# Patient Record
Sex: Male | Born: 1945 | Race: White | Hispanic: No | Smoking: Never smoker
Health system: Southern US, Community
[De-identification: ages and names within clinical notes are randomized; demographics above are authoritative.]

## PROBLEM LIST (undated history)

## (undated) DIAGNOSIS — N400 Enlarged prostate without lower urinary tract symptoms: Secondary | ICD-10-CM

## (undated) HISTORY — PX: EXCISIONAL HEMORRHOIDECTOMY: SHX1541

## (undated) HISTORY — PX: KNEE ARTHROSCOPY: SUR90

## (undated) HISTORY — PX: BACK SURGERY: SHX140

## (undated) HISTORY — DX: Benign prostatic hyperplasia without lower urinary tract symptoms: N40.0

## (undated) HISTORY — PX: TONSILLECTOMY: SUR1361

---

## 2016-12-01 DIAGNOSIS — H2513 Age-related nuclear cataract, bilateral: Secondary | ICD-10-CM | POA: Diagnosis not present

## 2016-12-01 DIAGNOSIS — H5203 Hypermetropia, bilateral: Secondary | ICD-10-CM | POA: Diagnosis not present

## 2016-12-01 DIAGNOSIS — H524 Presbyopia: Secondary | ICD-10-CM | POA: Diagnosis not present

## 2016-12-01 DIAGNOSIS — H3554 Dystrophies primarily involving the retinal pigment epithelium: Secondary | ICD-10-CM | POA: Diagnosis not present

## 2016-12-01 DIAGNOSIS — H52223 Regular astigmatism, bilateral: Secondary | ICD-10-CM | POA: Diagnosis not present

## 2017-01-10 DIAGNOSIS — Z139 Encounter for screening, unspecified: Secondary | ICD-10-CM | POA: Diagnosis not present

## 2017-01-10 DIAGNOSIS — J111 Influenza due to unidentified influenza virus with other respiratory manifestations: Secondary | ICD-10-CM | POA: Diagnosis not present

## 2017-01-10 DIAGNOSIS — J40 Bronchitis, not specified as acute or chronic: Secondary | ICD-10-CM | POA: Diagnosis not present

## 2017-01-10 DIAGNOSIS — Z789 Other specified health status: Secondary | ICD-10-CM | POA: Diagnosis not present

## 2017-01-10 DIAGNOSIS — Z6830 Body mass index (BMI) 30.0-30.9, adult: Secondary | ICD-10-CM | POA: Diagnosis not present

## 2017-02-18 DIAGNOSIS — Z789 Other specified health status: Secondary | ICD-10-CM | POA: Diagnosis not present

## 2017-02-18 DIAGNOSIS — R002 Palpitations: Secondary | ICD-10-CM | POA: Diagnosis not present

## 2017-02-18 DIAGNOSIS — R001 Bradycardia, unspecified: Secondary | ICD-10-CM | POA: Diagnosis not present

## 2017-02-28 DIAGNOSIS — R42 Dizziness and giddiness: Secondary | ICD-10-CM | POA: Diagnosis not present

## 2017-02-28 DIAGNOSIS — R001 Bradycardia, unspecified: Secondary | ICD-10-CM | POA: Diagnosis not present

## 2017-03-04 DIAGNOSIS — R002 Palpitations: Secondary | ICD-10-CM | POA: Diagnosis not present

## 2017-03-07 DIAGNOSIS — R42 Dizziness and giddiness: Secondary | ICD-10-CM | POA: Diagnosis not present

## 2017-03-07 DIAGNOSIS — R001 Bradycardia, unspecified: Secondary | ICD-10-CM | POA: Diagnosis not present

## 2017-05-12 DIAGNOSIS — R350 Frequency of micturition: Secondary | ICD-10-CM | POA: Diagnosis not present

## 2017-05-12 DIAGNOSIS — R351 Nocturia: Secondary | ICD-10-CM | POA: Diagnosis not present

## 2017-05-12 DIAGNOSIS — R3914 Feeling of incomplete bladder emptying: Secondary | ICD-10-CM | POA: Diagnosis not present

## 2017-05-12 DIAGNOSIS — R972 Elevated prostate specific antigen [PSA]: Secondary | ICD-10-CM | POA: Diagnosis not present

## 2017-05-12 DIAGNOSIS — N400 Enlarged prostate without lower urinary tract symptoms: Secondary | ICD-10-CM | POA: Diagnosis not present

## 2017-05-16 DIAGNOSIS — M9903 Segmental and somatic dysfunction of lumbar region: Secondary | ICD-10-CM | POA: Diagnosis not present

## 2017-05-16 DIAGNOSIS — S33141A Dislocation of L4/L5 lumbar vertebra, initial encounter: Secondary | ICD-10-CM | POA: Diagnosis not present

## 2017-05-16 DIAGNOSIS — M9902 Segmental and somatic dysfunction of thoracic region: Secondary | ICD-10-CM | POA: Diagnosis not present

## 2017-10-12 DIAGNOSIS — S33141A Dislocation of L4/L5 lumbar vertebra, initial encounter: Secondary | ICD-10-CM | POA: Diagnosis not present

## 2017-10-12 DIAGNOSIS — M9903 Segmental and somatic dysfunction of lumbar region: Secondary | ICD-10-CM | POA: Diagnosis not present

## 2017-10-12 DIAGNOSIS — M9902 Segmental and somatic dysfunction of thoracic region: Secondary | ICD-10-CM | POA: Diagnosis not present

## 2017-12-07 DIAGNOSIS — H3550 Unspecified hereditary retinal dystrophy: Secondary | ICD-10-CM | POA: Diagnosis not present

## 2017-12-07 DIAGNOSIS — H2513 Age-related nuclear cataract, bilateral: Secondary | ICD-10-CM | POA: Diagnosis not present

## 2017-12-07 DIAGNOSIS — H5203 Hypermetropia, bilateral: Secondary | ICD-10-CM | POA: Diagnosis not present

## 2017-12-07 DIAGNOSIS — H52223 Regular astigmatism, bilateral: Secondary | ICD-10-CM | POA: Diagnosis not present

## 2017-12-07 DIAGNOSIS — H524 Presbyopia: Secondary | ICD-10-CM | POA: Diagnosis not present

## 2018-01-30 DIAGNOSIS — M9902 Segmental and somatic dysfunction of thoracic region: Secondary | ICD-10-CM | POA: Diagnosis not present

## 2018-01-30 DIAGNOSIS — S33141A Dislocation of L4/L5 lumbar vertebra, initial encounter: Secondary | ICD-10-CM | POA: Diagnosis not present

## 2018-01-30 DIAGNOSIS — M9903 Segmental and somatic dysfunction of lumbar region: Secondary | ICD-10-CM | POA: Diagnosis not present

## 2018-02-06 DIAGNOSIS — M25551 Pain in right hip: Secondary | ICD-10-CM | POA: Diagnosis not present

## 2018-02-06 DIAGNOSIS — J069 Acute upper respiratory infection, unspecified: Secondary | ICD-10-CM | POA: Diagnosis not present

## 2018-02-06 DIAGNOSIS — R05 Cough: Secondary | ICD-10-CM | POA: Diagnosis not present

## 2018-04-21 DIAGNOSIS — M1611 Unilateral primary osteoarthritis, right hip: Secondary | ICD-10-CM | POA: Diagnosis not present

## 2018-04-21 DIAGNOSIS — M545 Low back pain: Secondary | ICD-10-CM | POA: Diagnosis not present

## 2018-04-21 DIAGNOSIS — M25511 Pain in right shoulder: Secondary | ICD-10-CM | POA: Diagnosis not present

## 2018-04-21 DIAGNOSIS — M19012 Primary osteoarthritis, left shoulder: Secondary | ICD-10-CM | POA: Diagnosis not present

## 2018-05-15 DIAGNOSIS — S80869A Insect bite (nonvenomous), unspecified lower leg, initial encounter: Secondary | ICD-10-CM | POA: Diagnosis not present

## 2018-05-15 DIAGNOSIS — M791 Myalgia, unspecified site: Secondary | ICD-10-CM | POA: Diagnosis not present

## 2018-05-19 DIAGNOSIS — N400 Enlarged prostate without lower urinary tract symptoms: Secondary | ICD-10-CM | POA: Diagnosis not present

## 2018-05-19 DIAGNOSIS — R351 Nocturia: Secondary | ICD-10-CM | POA: Diagnosis not present

## 2018-05-19 DIAGNOSIS — R972 Elevated prostate specific antigen [PSA]: Secondary | ICD-10-CM | POA: Diagnosis not present

## 2018-05-19 DIAGNOSIS — Z6829 Body mass index (BMI) 29.0-29.9, adult: Secondary | ICD-10-CM | POA: Diagnosis not present

## 2018-06-08 DIAGNOSIS — Z6829 Body mass index (BMI) 29.0-29.9, adult: Secondary | ICD-10-CM | POA: Diagnosis not present

## 2018-06-08 DIAGNOSIS — R972 Elevated prostate specific antigen [PSA]: Secondary | ICD-10-CM | POA: Diagnosis not present

## 2018-06-08 DIAGNOSIS — N400 Enlarged prostate without lower urinary tract symptoms: Secondary | ICD-10-CM | POA: Diagnosis not present

## 2018-06-08 DIAGNOSIS — R351 Nocturia: Secondary | ICD-10-CM | POA: Diagnosis not present

## 2018-06-26 DIAGNOSIS — R972 Elevated prostate specific antigen [PSA]: Secondary | ICD-10-CM | POA: Diagnosis not present

## 2018-07-04 DIAGNOSIS — Z6832 Body mass index (BMI) 32.0-32.9, adult: Secondary | ICD-10-CM | POA: Diagnosis not present

## 2018-07-04 DIAGNOSIS — M353 Polymyalgia rheumatica: Secondary | ICD-10-CM | POA: Diagnosis not present

## 2018-07-04 DIAGNOSIS — M791 Myalgia, unspecified site: Secondary | ICD-10-CM | POA: Diagnosis not present

## 2018-07-04 DIAGNOSIS — E669 Obesity, unspecified: Secondary | ICD-10-CM | POA: Diagnosis not present

## 2018-07-04 DIAGNOSIS — Z7952 Long term (current) use of systemic steroids: Secondary | ICD-10-CM | POA: Diagnosis not present

## 2018-07-04 DIAGNOSIS — M2550 Pain in unspecified joint: Secondary | ICD-10-CM | POA: Diagnosis not present

## 2018-07-04 DIAGNOSIS — R700 Elevated erythrocyte sedimentation rate: Secondary | ICD-10-CM | POA: Diagnosis not present

## 2018-07-10 DIAGNOSIS — N400 Enlarged prostate without lower urinary tract symptoms: Secondary | ICD-10-CM | POA: Diagnosis not present

## 2018-07-10 DIAGNOSIS — R351 Nocturia: Secondary | ICD-10-CM | POA: Diagnosis not present

## 2018-07-10 DIAGNOSIS — Z6829 Body mass index (BMI) 29.0-29.9, adult: Secondary | ICD-10-CM | POA: Diagnosis not present

## 2018-07-10 DIAGNOSIS — R972 Elevated prostate specific antigen [PSA]: Secondary | ICD-10-CM | POA: Diagnosis not present

## 2018-07-14 DIAGNOSIS — R972 Elevated prostate specific antigen [PSA]: Secondary | ICD-10-CM | POA: Diagnosis not present

## 2018-07-26 DIAGNOSIS — N400 Enlarged prostate without lower urinary tract symptoms: Secondary | ICD-10-CM | POA: Diagnosis not present

## 2018-07-26 DIAGNOSIS — R351 Nocturia: Secondary | ICD-10-CM | POA: Diagnosis not present

## 2018-07-26 DIAGNOSIS — R972 Elevated prostate specific antigen [PSA]: Secondary | ICD-10-CM | POA: Diagnosis not present

## 2018-08-18 DIAGNOSIS — M791 Myalgia, unspecified site: Secondary | ICD-10-CM | POA: Diagnosis not present

## 2018-08-18 DIAGNOSIS — Z7952 Long term (current) use of systemic steroids: Secondary | ICD-10-CM | POA: Diagnosis not present

## 2018-08-18 DIAGNOSIS — Z6833 Body mass index (BMI) 33.0-33.9, adult: Secondary | ICD-10-CM | POA: Diagnosis not present

## 2018-08-18 DIAGNOSIS — M353 Polymyalgia rheumatica: Secondary | ICD-10-CM | POA: Diagnosis not present

## 2018-08-18 DIAGNOSIS — E669 Obesity, unspecified: Secondary | ICD-10-CM | POA: Diagnosis not present

## 2018-08-18 DIAGNOSIS — R700 Elevated erythrocyte sedimentation rate: Secondary | ICD-10-CM | POA: Diagnosis not present

## 2018-08-18 DIAGNOSIS — M2550 Pain in unspecified joint: Secondary | ICD-10-CM | POA: Diagnosis not present

## 2018-10-03 DIAGNOSIS — R700 Elevated erythrocyte sedimentation rate: Secondary | ICD-10-CM | POA: Diagnosis not present

## 2018-10-03 DIAGNOSIS — Z7952 Long term (current) use of systemic steroids: Secondary | ICD-10-CM | POA: Diagnosis not present

## 2018-10-03 DIAGNOSIS — Z6832 Body mass index (BMI) 32.0-32.9, adult: Secondary | ICD-10-CM | POA: Diagnosis not present

## 2018-10-03 DIAGNOSIS — E669 Obesity, unspecified: Secondary | ICD-10-CM | POA: Diagnosis not present

## 2018-10-03 DIAGNOSIS — M353 Polymyalgia rheumatica: Secondary | ICD-10-CM | POA: Diagnosis not present

## 2018-10-03 DIAGNOSIS — M791 Myalgia, unspecified site: Secondary | ICD-10-CM | POA: Diagnosis not present

## 2018-10-03 DIAGNOSIS — M2550 Pain in unspecified joint: Secondary | ICD-10-CM | POA: Diagnosis not present

## 2018-10-26 DIAGNOSIS — R350 Frequency of micturition: Secondary | ICD-10-CM | POA: Diagnosis not present

## 2018-10-26 DIAGNOSIS — N401 Enlarged prostate with lower urinary tract symptoms: Secondary | ICD-10-CM | POA: Diagnosis not present

## 2018-10-26 DIAGNOSIS — R3912 Poor urinary stream: Secondary | ICD-10-CM | POA: Diagnosis not present

## 2018-10-26 DIAGNOSIS — Z6829 Body mass index (BMI) 29.0-29.9, adult: Secondary | ICD-10-CM | POA: Diagnosis not present

## 2018-10-26 DIAGNOSIS — R972 Elevated prostate specific antigen [PSA]: Secondary | ICD-10-CM | POA: Diagnosis not present

## 2018-12-05 DIAGNOSIS — Z7952 Long term (current) use of systemic steroids: Secondary | ICD-10-CM | POA: Diagnosis not present

## 2018-12-05 DIAGNOSIS — M353 Polymyalgia rheumatica: Secondary | ICD-10-CM | POA: Diagnosis not present

## 2018-12-05 DIAGNOSIS — M791 Myalgia, unspecified site: Secondary | ICD-10-CM | POA: Diagnosis not present

## 2018-12-05 DIAGNOSIS — E669 Obesity, unspecified: Secondary | ICD-10-CM | POA: Diagnosis not present

## 2018-12-05 DIAGNOSIS — Z6833 Body mass index (BMI) 33.0-33.9, adult: Secondary | ICD-10-CM | POA: Diagnosis not present

## 2018-12-05 DIAGNOSIS — M2550 Pain in unspecified joint: Secondary | ICD-10-CM | POA: Diagnosis not present

## 2018-12-05 DIAGNOSIS — R700 Elevated erythrocyte sedimentation rate: Secondary | ICD-10-CM | POA: Diagnosis not present

## 2018-12-12 DIAGNOSIS — H52223 Regular astigmatism, bilateral: Secondary | ICD-10-CM | POA: Diagnosis not present

## 2018-12-12 DIAGNOSIS — H524 Presbyopia: Secondary | ICD-10-CM | POA: Diagnosis not present

## 2018-12-12 DIAGNOSIS — H2513 Age-related nuclear cataract, bilateral: Secondary | ICD-10-CM | POA: Diagnosis not present

## 2018-12-12 DIAGNOSIS — H3550 Unspecified hereditary retinal dystrophy: Secondary | ICD-10-CM | POA: Diagnosis not present

## 2018-12-12 DIAGNOSIS — H5203 Hypermetropia, bilateral: Secondary | ICD-10-CM | POA: Diagnosis not present

## 2018-12-25 DIAGNOSIS — S33141A Dislocation of L4/L5 lumbar vertebra, initial encounter: Secondary | ICD-10-CM | POA: Diagnosis not present

## 2018-12-25 DIAGNOSIS — M9902 Segmental and somatic dysfunction of thoracic region: Secondary | ICD-10-CM | POA: Diagnosis not present

## 2018-12-25 DIAGNOSIS — M9903 Segmental and somatic dysfunction of lumbar region: Secondary | ICD-10-CM | POA: Diagnosis not present

## 2019-02-13 DIAGNOSIS — M2550 Pain in unspecified joint: Secondary | ICD-10-CM | POA: Diagnosis not present

## 2019-02-13 DIAGNOSIS — R700 Elevated erythrocyte sedimentation rate: Secondary | ICD-10-CM | POA: Diagnosis not present

## 2019-02-13 DIAGNOSIS — E669 Obesity, unspecified: Secondary | ICD-10-CM | POA: Diagnosis not present

## 2019-02-13 DIAGNOSIS — M791 Myalgia, unspecified site: Secondary | ICD-10-CM | POA: Diagnosis not present

## 2019-02-13 DIAGNOSIS — M353 Polymyalgia rheumatica: Secondary | ICD-10-CM | POA: Diagnosis not present

## 2019-02-13 DIAGNOSIS — Z6833 Body mass index (BMI) 33.0-33.9, adult: Secondary | ICD-10-CM | POA: Diagnosis not present

## 2019-02-13 DIAGNOSIS — Z7952 Long term (current) use of systemic steroids: Secondary | ICD-10-CM | POA: Diagnosis not present

## 2019-05-15 DIAGNOSIS — M25473 Effusion, unspecified ankle: Secondary | ICD-10-CM | POA: Diagnosis not present

## 2019-05-15 DIAGNOSIS — M791 Myalgia, unspecified site: Secondary | ICD-10-CM | POA: Diagnosis not present

## 2019-05-15 DIAGNOSIS — M353 Polymyalgia rheumatica: Secondary | ICD-10-CM | POA: Diagnosis not present

## 2019-05-15 DIAGNOSIS — Z6832 Body mass index (BMI) 32.0-32.9, adult: Secondary | ICD-10-CM | POA: Diagnosis not present

## 2019-05-15 DIAGNOSIS — E669 Obesity, unspecified: Secondary | ICD-10-CM | POA: Diagnosis not present

## 2019-05-15 DIAGNOSIS — R700 Elevated erythrocyte sedimentation rate: Secondary | ICD-10-CM | POA: Diagnosis not present

## 2019-05-15 DIAGNOSIS — Z7952 Long term (current) use of systemic steroids: Secondary | ICD-10-CM | POA: Diagnosis not present

## 2019-05-15 DIAGNOSIS — M2550 Pain in unspecified joint: Secondary | ICD-10-CM | POA: Diagnosis not present

## 2019-05-21 DIAGNOSIS — R3912 Poor urinary stream: Secondary | ICD-10-CM | POA: Diagnosis not present

## 2019-05-21 DIAGNOSIS — R972 Elevated prostate specific antigen [PSA]: Secondary | ICD-10-CM | POA: Diagnosis not present

## 2019-05-21 DIAGNOSIS — N401 Enlarged prostate with lower urinary tract symptoms: Secondary | ICD-10-CM | POA: Diagnosis not present

## 2019-05-21 DIAGNOSIS — R350 Frequency of micturition: Secondary | ICD-10-CM | POA: Diagnosis not present

## 2019-07-02 DIAGNOSIS — L255 Unspecified contact dermatitis due to plants, except food: Secondary | ICD-10-CM | POA: Diagnosis not present

## 2019-08-25 DIAGNOSIS — Z1159 Encounter for screening for other viral diseases: Secondary | ICD-10-CM | POA: Diagnosis not present

## 2019-08-25 DIAGNOSIS — Z20828 Contact with and (suspected) exposure to other viral communicable diseases: Secondary | ICD-10-CM | POA: Diagnosis not present

## 2019-12-14 DIAGNOSIS — M67911 Unspecified disorder of synovium and tendon, right shoulder: Secondary | ICD-10-CM | POA: Diagnosis not present

## 2019-12-18 DIAGNOSIS — M25511 Pain in right shoulder: Secondary | ICD-10-CM | POA: Diagnosis not present

## 2019-12-21 DIAGNOSIS — M75121 Complete rotator cuff tear or rupture of right shoulder, not specified as traumatic: Secondary | ICD-10-CM | POA: Diagnosis not present

## 2019-12-24 DIAGNOSIS — S33141A Dislocation of L4/L5 lumbar vertebra, initial encounter: Secondary | ICD-10-CM | POA: Diagnosis not present

## 2019-12-24 DIAGNOSIS — M9902 Segmental and somatic dysfunction of thoracic region: Secondary | ICD-10-CM | POA: Diagnosis not present

## 2019-12-24 DIAGNOSIS — M9903 Segmental and somatic dysfunction of lumbar region: Secondary | ICD-10-CM | POA: Diagnosis not present

## 2019-12-24 HISTORY — PX: OTHER SURGICAL HISTORY: SHX169

## 2020-01-01 DIAGNOSIS — G8918 Other acute postprocedural pain: Secondary | ICD-10-CM | POA: Diagnosis not present

## 2020-01-01 DIAGNOSIS — M7541 Impingement syndrome of right shoulder: Secondary | ICD-10-CM | POA: Diagnosis not present

## 2020-01-01 DIAGNOSIS — M7521 Bicipital tendinitis, right shoulder: Secondary | ICD-10-CM | POA: Diagnosis not present

## 2020-01-01 DIAGNOSIS — M75111 Incomplete rotator cuff tear or rupture of right shoulder, not specified as traumatic: Secondary | ICD-10-CM | POA: Diagnosis not present

## 2020-01-01 DIAGNOSIS — M67813 Other specified disorders of tendon, right shoulder: Secondary | ICD-10-CM | POA: Diagnosis not present

## 2020-01-14 DIAGNOSIS — Z4789 Encounter for other orthopedic aftercare: Secondary | ICD-10-CM | POA: Diagnosis not present

## 2020-01-14 DIAGNOSIS — M75111 Incomplete rotator cuff tear or rupture of right shoulder, not specified as traumatic: Secondary | ICD-10-CM | POA: Diagnosis not present

## 2020-01-22 DIAGNOSIS — M75111 Incomplete rotator cuff tear or rupture of right shoulder, not specified as traumatic: Secondary | ICD-10-CM | POA: Diagnosis not present

## 2020-01-22 DIAGNOSIS — Z4789 Encounter for other orthopedic aftercare: Secondary | ICD-10-CM | POA: Diagnosis not present

## 2020-01-29 DIAGNOSIS — M75111 Incomplete rotator cuff tear or rupture of right shoulder, not specified as traumatic: Secondary | ICD-10-CM | POA: Diagnosis not present

## 2020-01-29 DIAGNOSIS — Z4789 Encounter for other orthopedic aftercare: Secondary | ICD-10-CM | POA: Diagnosis not present

## 2020-02-06 DIAGNOSIS — Z4789 Encounter for other orthopedic aftercare: Secondary | ICD-10-CM | POA: Diagnosis not present

## 2020-02-06 DIAGNOSIS — M75111 Incomplete rotator cuff tear or rupture of right shoulder, not specified as traumatic: Secondary | ICD-10-CM | POA: Diagnosis not present

## 2020-02-08 DIAGNOSIS — H2513 Age-related nuclear cataract, bilateral: Secondary | ICD-10-CM | POA: Diagnosis not present

## 2020-02-08 DIAGNOSIS — H3550 Unspecified hereditary retinal dystrophy: Secondary | ICD-10-CM | POA: Diagnosis not present

## 2020-02-08 DIAGNOSIS — H5203 Hypermetropia, bilateral: Secondary | ICD-10-CM | POA: Diagnosis not present

## 2020-02-08 DIAGNOSIS — H524 Presbyopia: Secondary | ICD-10-CM | POA: Diagnosis not present

## 2020-02-08 DIAGNOSIS — H52223 Regular astigmatism, bilateral: Secondary | ICD-10-CM | POA: Diagnosis not present

## 2020-02-12 DIAGNOSIS — Z4789 Encounter for other orthopedic aftercare: Secondary | ICD-10-CM | POA: Diagnosis not present

## 2020-02-12 DIAGNOSIS — M75111 Incomplete rotator cuff tear or rupture of right shoulder, not specified as traumatic: Secondary | ICD-10-CM | POA: Diagnosis not present

## 2020-02-15 DIAGNOSIS — Z4789 Encounter for other orthopedic aftercare: Secondary | ICD-10-CM | POA: Diagnosis not present

## 2020-02-15 DIAGNOSIS — M75111 Incomplete rotator cuff tear or rupture of right shoulder, not specified as traumatic: Secondary | ICD-10-CM | POA: Diagnosis not present

## 2020-02-19 DIAGNOSIS — M75111 Incomplete rotator cuff tear or rupture of right shoulder, not specified as traumatic: Secondary | ICD-10-CM | POA: Diagnosis not present

## 2020-02-19 DIAGNOSIS — Z4789 Encounter for other orthopedic aftercare: Secondary | ICD-10-CM | POA: Diagnosis not present

## 2020-02-21 DIAGNOSIS — Z4789 Encounter for other orthopedic aftercare: Secondary | ICD-10-CM | POA: Diagnosis not present

## 2020-02-21 DIAGNOSIS — M75111 Incomplete rotator cuff tear or rupture of right shoulder, not specified as traumatic: Secondary | ICD-10-CM | POA: Diagnosis not present

## 2020-02-25 DIAGNOSIS — Z4789 Encounter for other orthopedic aftercare: Secondary | ICD-10-CM | POA: Diagnosis not present

## 2020-02-25 DIAGNOSIS — M75111 Incomplete rotator cuff tear or rupture of right shoulder, not specified as traumatic: Secondary | ICD-10-CM | POA: Diagnosis not present

## 2020-02-27 DIAGNOSIS — Z4789 Encounter for other orthopedic aftercare: Secondary | ICD-10-CM | POA: Diagnosis not present

## 2020-02-27 DIAGNOSIS — M75111 Incomplete rotator cuff tear or rupture of right shoulder, not specified as traumatic: Secondary | ICD-10-CM | POA: Diagnosis not present

## 2020-03-04 DIAGNOSIS — M75111 Incomplete rotator cuff tear or rupture of right shoulder, not specified as traumatic: Secondary | ICD-10-CM | POA: Diagnosis not present

## 2020-03-04 DIAGNOSIS — Z4789 Encounter for other orthopedic aftercare: Secondary | ICD-10-CM | POA: Diagnosis not present

## 2020-03-06 DIAGNOSIS — Z4789 Encounter for other orthopedic aftercare: Secondary | ICD-10-CM | POA: Diagnosis not present

## 2020-03-06 DIAGNOSIS — M75111 Incomplete rotator cuff tear or rupture of right shoulder, not specified as traumatic: Secondary | ICD-10-CM | POA: Diagnosis not present

## 2020-03-10 DIAGNOSIS — M75111 Incomplete rotator cuff tear or rupture of right shoulder, not specified as traumatic: Secondary | ICD-10-CM | POA: Diagnosis not present

## 2020-03-10 DIAGNOSIS — Z4789 Encounter for other orthopedic aftercare: Secondary | ICD-10-CM | POA: Diagnosis not present

## 2020-03-13 DIAGNOSIS — Z4789 Encounter for other orthopedic aftercare: Secondary | ICD-10-CM | POA: Diagnosis not present

## 2020-03-13 DIAGNOSIS — M75111 Incomplete rotator cuff tear or rupture of right shoulder, not specified as traumatic: Secondary | ICD-10-CM | POA: Diagnosis not present

## 2020-03-17 DIAGNOSIS — Z4789 Encounter for other orthopedic aftercare: Secondary | ICD-10-CM | POA: Diagnosis not present

## 2020-03-17 DIAGNOSIS — M75111 Incomplete rotator cuff tear or rupture of right shoulder, not specified as traumatic: Secondary | ICD-10-CM | POA: Diagnosis not present

## 2020-03-19 DIAGNOSIS — Z9889 Other specified postprocedural states: Secondary | ICD-10-CM | POA: Diagnosis not present

## 2020-03-19 DIAGNOSIS — M75111 Incomplete rotator cuff tear or rupture of right shoulder, not specified as traumatic: Secondary | ICD-10-CM | POA: Diagnosis not present

## 2020-03-19 DIAGNOSIS — Z4789 Encounter for other orthopedic aftercare: Secondary | ICD-10-CM | POA: Diagnosis not present

## 2020-03-25 DIAGNOSIS — M75111 Incomplete rotator cuff tear or rupture of right shoulder, not specified as traumatic: Secondary | ICD-10-CM | POA: Diagnosis not present

## 2020-03-25 DIAGNOSIS — Z4789 Encounter for other orthopedic aftercare: Secondary | ICD-10-CM | POA: Diagnosis not present

## 2020-04-02 DIAGNOSIS — M75111 Incomplete rotator cuff tear or rupture of right shoulder, not specified as traumatic: Secondary | ICD-10-CM | POA: Diagnosis not present

## 2020-04-02 DIAGNOSIS — Z4789 Encounter for other orthopedic aftercare: Secondary | ICD-10-CM | POA: Diagnosis not present

## 2020-04-09 DIAGNOSIS — M75111 Incomplete rotator cuff tear or rupture of right shoulder, not specified as traumatic: Secondary | ICD-10-CM | POA: Diagnosis not present

## 2020-04-09 DIAGNOSIS — Z4789 Encounter for other orthopedic aftercare: Secondary | ICD-10-CM | POA: Diagnosis not present

## 2020-04-15 DIAGNOSIS — M9903 Segmental and somatic dysfunction of lumbar region: Secondary | ICD-10-CM | POA: Diagnosis not present

## 2020-04-15 DIAGNOSIS — M9902 Segmental and somatic dysfunction of thoracic region: Secondary | ICD-10-CM | POA: Diagnosis not present

## 2020-04-15 DIAGNOSIS — S33141A Dislocation of L4/L5 lumbar vertebra, initial encounter: Secondary | ICD-10-CM | POA: Diagnosis not present

## 2020-04-30 DIAGNOSIS — Z9889 Other specified postprocedural states: Secondary | ICD-10-CM | POA: Diagnosis not present

## 2020-04-30 DIAGNOSIS — M75111 Incomplete rotator cuff tear or rupture of right shoulder, not specified as traumatic: Secondary | ICD-10-CM | POA: Diagnosis not present

## 2020-05-02 DIAGNOSIS — R972 Elevated prostate specific antigen [PSA]: Secondary | ICD-10-CM | POA: Diagnosis not present

## 2020-05-02 DIAGNOSIS — R351 Nocturia: Secondary | ICD-10-CM | POA: Diagnosis not present

## 2020-05-02 DIAGNOSIS — N400 Enlarged prostate without lower urinary tract symptoms: Secondary | ICD-10-CM | POA: Diagnosis not present

## 2020-06-02 ENCOUNTER — Encounter: Payer: Self-pay | Admitting: *Deleted

## 2020-07-29 ENCOUNTER — Ambulatory Visit (INDEPENDENT_AMBULATORY_CARE_PROVIDER_SITE_OTHER): Payer: Self-pay | Admitting: *Deleted

## 2020-07-29 ENCOUNTER — Other Ambulatory Visit: Payer: Self-pay

## 2020-07-29 VITALS — Ht 72.0 in | Wt 224.4 lb

## 2020-07-29 DIAGNOSIS — Z1211 Encounter for screening for malignant neoplasm of colon: Secondary | ICD-10-CM

## 2020-07-29 NOTE — Progress Notes (Signed)
Gastroenterology Pre-Procedure Review  Request Date: 07/29/2020 Requesting Physician: Dr. Will Bonnet, Last TCS done 11 years ago by Dr. Algis Greenhouse, no polyps per pt  PATIENT REVIEW QUESTIONS: The patient responded to the following health history questions as indicated:    1. Diabetes Melitis: no 2. Joint replacements in the past 12 months: no 3. Major health problems in the past 3 months: no 4. Has an artificial valve or MVP: no 5. Has a defibrillator: no 6. Has been advised in past to take antibiotics in advance of a procedure like teeth cleaning: no 7. Family history of colon cancer:  no 8. Alcohol Use: yes, 24 beers a week 9. Illicit drug Use: no 10. History of sleep apnea: no 11. History of coronary artery or other vascular stents placed within the last 12 months: no 12. History of any prior anesthesia complications: no 13. Body mass index is 30.43 kg/m.    MEDICATIONS & ALLERGIES:    Patient reports the following regarding taking any blood thinners:   Plavix? no Aspirin? Yes, 3 to 4 a week Coumadin? no Brilinta? no Xarelto? no Eliquis? no Pradaxa? no Savaysa? no Effient? no  Patient confirms/reports the following medications:  Current Outpatient Medications  Medication Sig Dispense Refill  . CRANBERRY PO Take by mouth daily.    . Cyanocobalamin (B-12 PO) Take by mouth daily.    . Turmeric (QC TUMERIC COMPLEX PO) Take by mouth daily.     No current facility-administered medications for this visit.    Patient confirms/reports the following allergies:  No Known Allergies  No orders of the defined types were placed in this encounter.   AUTHORIZATION INFORMATION Primary Insurance: Medicare,  ID #: [id number] Pre-Cert / Auth required: No, not required  Secondary Insurance: BCBS,  Mustang #: [id number],  Group #: [id number] Pre-Cert / Auth required:  Pre-Cert / Auth #:   SCHEDULE INFORMATION: Procedure has been scheduled as follows:  Date: , Time:  Location: [facility] with  Dr. Gala Romney  This Gastroenterology Pre-Precedure Review Form is being routed to the following provider(s): Neil Crouch, PA

## 2020-07-29 NOTE — Progress Notes (Signed)
Pt had shoulder surgery 01/01/20 by Dr. Tamera Punt in [place].  Pt remembered that he takes ASA 81 mg 3 to 4 times a week. Added to med list.

## 2020-07-29 NOTE — Progress Notes (Signed)
Needs ov due to etoh use and will need propofol.

## 2020-08-01 ENCOUNTER — Encounter: Payer: Self-pay | Admitting: Gastroenterology

## 2020-08-01 ENCOUNTER — Ambulatory Visit (INDEPENDENT_AMBULATORY_CARE_PROVIDER_SITE_OTHER): Payer: Medicare Other | Admitting: Gastroenterology

## 2020-08-01 ENCOUNTER — Other Ambulatory Visit: Payer: Self-pay

## 2020-08-01 DIAGNOSIS — Z1211 Encounter for screening for malignant neoplasm of colon: Secondary | ICD-10-CM

## 2020-08-01 DIAGNOSIS — R198 Other specified symptoms and signs involving the digestive system and abdomen: Secondary | ICD-10-CM | POA: Insufficient documentation

## 2020-08-01 NOTE — Progress Notes (Signed)
Pt is scheduled for ov on 08/01/2020.

## 2020-08-01 NOTE — Progress Notes (Signed)
Primary Care Physician:  Josem Kaufmann, MD Referring provider: Dr. Cherre Huger Erskin Burnet, MD Primary Gastroenterologist:  Garfield Cornea, MD   Chief Complaint  Patient presents with  . Consult    TCS last done 11 years ago.    HPI:  Devin Gould is a 74 y.o. male here at the request of Dr. Will Bonnet for consideration of colonoscopy. Last colonoscopy was about 11 years ago by Dr. Algis Greenhouse.   Patient denies h/o colon polyps. BMs regular. No melena, brbpr. No abdominal pain. No UGI symptoms. H/o BPH with intermittent fluctuation of PSA, followed regularly by his urologist, Dr. Will Bonnet. Patient reports 0-4 beers daily. Some days no beer. Can go weeks at a time without a beer.  Most days averages 2 beers.    Current Outpatient Medications  Medication Sig Dispense Refill  . aspirin EC 81 MG tablet Take 81 mg by mouth 3 (three) times a week. Swallow whole.  Takes 3 to 4 times weekly.    . Coenzyme Q10 (COQ-10 PO) Take by mouth every other day.    Marland Kitchen CRANBERRY PO Take by mouth daily.    . Cyanocobalamin (B-12 PO) Take by mouth daily.    . Turmeric (QC TUMERIC COMPLEX PO) Take by mouth daily.     No current facility-administered medications for this visit.    Allergies as of 08/01/2020  . (No Known Allergies)    Past Medical History:  Diagnosis Date  . BPH (benign prostatic hyperplasia)     Past Surgical History:  Procedure Laterality Date  . BACK SURGERY    . EXCISIONAL HEMORRHOIDECTOMY    . KNEE ARTHROSCOPY Left   . Right shoulder surgery  12/2019  . TONSILLECTOMY      Family History  Problem Relation Age of Onset  . Colon cancer Neg Hx     Social History   Socioeconomic History  . Marital status: Single    Spouse name: Not on file  . Number of children: Not on file  . Years of education: Not on file  . Highest education level: Not on file  Occupational History  . Not on file  Tobacco Use  . Smoking status: Never Smoker  . Smokeless tobacco: Never Used   Substance and Sexual Activity  . Alcohol use: Yes    Comment: 0-4 beers per day  . Drug use: Never  . Sexual activity: Not on file  Other Topics Concern  . Not on file  Social History Narrative  . Not on file   Social Determinants of Health   Financial Resource Strain:   . Difficulty of Paying Living Expenses: Not on file  Food Insecurity:   . Worried About Charity fundraiser in the Last Year: Not on file  . Ran Out of Food in the Last Year: Not on file  Transportation Needs:   . Lack of Transportation (Medical): Not on file  . Lack of Transportation (Non-Medical): Not on file  Physical Activity:   . Days of Exercise per Week: Not on file  . Minutes of Exercise per Session: Not on file  Stress:   . Feeling of Stress : Not on file  Social Connections:   . Frequency of Communication with Friends and Family: Not on file  . Frequency of Social Gatherings with Friends and Family: Not on file  . Attends Religious Services: Not on file  . Active Member of Clubs or Organizations: Not on file  . Attends Archivist Meetings: Not  on file  . Marital Status: Not on file  Intimate Partner Violence:   . Fear of Current or Ex-Partner: Not on file  . Emotionally Abused: Not on file  . Physically Abused: Not on file  . Sexually Abused: Not on file      ROS:  General: Negative for anorexia, weight loss, fever, chills, fatigue, weakness. Eyes: Negative for vision changes.  ENT: Negative for hoarseness, difficulty swallowing , nasal congestion. CV: Negative for chest pain, angina, palpitations, dyspnea on exertion, peripheral edema.  Respiratory: Negative for dyspnea at rest, dyspnea on exertion, cough, sputum, wheezing.  GI: See history of present illness. GU:  Negative for dysuria, hematuria, urinary incontinence, urinary frequency, nocturnal urination.  MS: Negative for joint pain, low back pain.  Derm: Negative for rash or itching.  Neuro: Negative for weakness,  abnormal sensation, seizure, frequent headaches, memory loss, confusion.  Psych: Negative for anxiety, depression, suicidal ideation, hallucinations.  Endo: Negative for unusual weight change.  Heme: Negative for bruising or bleeding. Allergy: Negative for rash or hives.    Physical Examination:  BP (!) 145/79   Pulse 76   Temp (!) 97.5 F (36.4 C) (Oral)   Ht 6' (1.829 m)   Wt 223 lb 6.4 oz (101.3 kg)   BMI 30.30 kg/m    General: Well-nourished, well-developed in no acute distress.  Head: Normocephalic, atraumatic.   Eyes: Conjunctiva pink, no icterus. Mouth: masked Neck: Supple without thyromegaly, masses, or lymphadenopathy.  Lungs: Clear to auscultation bilaterally.  Heart: Regular rate and rhythm, no murmurs rubs or gallops.  Abdomen: Bowel sounds are normal, nontender, nondistended, no hepatosplenomegaly.no abdominal bruits or    hernia , no rebound or guarding.  Fullness located in midline and to right of midline below the umbilicus. No obvious mass Rectal: not performed Extremities: No lower extremity edema. No clubbing or deformities.  Neuro: Alert and oriented x 4 , grossly normal neurologically.  Skin: Warm and dry, no rash or jaundice.   Psych: Alert and cooperative, normal mood and affect.   Imaging Studies: No results found.  Impression/Plan:  74 y/o male presents to schedule screening colonoscopy. Plan for deep sedation given moderate etoh use. ASA II.  I have discussed the risks, alternatives, benefits with regards to but not limited to the risk of reaction to medication, bleeding, infection, perforation and the patient is agreeable to proceed. Written consent to be obtained.  Consider CT abd/pelvis with contrast to evaluate abdominal fullness seen on exam. If colonoscopy scheduled out several weeks, would do CT prior.

## 2020-08-01 NOTE — Patient Instructions (Signed)
1. Coloscopy to be scheduled. 2. Consider imaging of fullness in the pelvic area. Await colonoscopy findings and exam with Dr. Gala Romney.

## 2020-08-04 ENCOUNTER — Telehealth: Payer: Self-pay | Admitting: *Deleted

## 2020-08-04 DIAGNOSIS — R198 Other specified symptoms and signs involving the digestive system and abdomen: Secondary | ICD-10-CM

## 2020-08-04 NOTE — Telephone Encounter (Signed)
Called pt and scheduled TCS with Dr. Gala Romney with propofol on 10/21 at 7:30am. Offered sooner appt but patient requested AM appt. Advised will mail prep instructions with his covid test appt. Confirmed mailing address is correct.

## 2020-08-05 NOTE — Telephone Encounter (Signed)
Noted. Discussed with patient at time of ov, if tcs delayed we would offer CT A/P with contrast for lower abdominal fullness on exam. He will need met-7.   Please schedule if he is agreeable.

## 2020-08-06 ENCOUNTER — Telehealth: Payer: Self-pay | Admitting: Internal Medicine

## 2020-08-06 NOTE — Telephone Encounter (Signed)
Pt said he was returning a call. I told him that the scheduler had LM on his VM and read to him the phone note. [phone]

## 2020-08-06 NOTE — Telephone Encounter (Signed)
Called pt. He is agreeable to have CT done.  Called and CT scheduled. Patient aware of appt details. Letter mailed as well

## 2020-08-06 NOTE — Telephone Encounter (Signed)
LMOVM. Called central scheduling and they are booking into OCT for CT's as well.

## 2020-08-06 NOTE — Addendum Note (Signed)
Addended by: Cheron Every on: 08/06/2020 01:59 PM   Modules accepted: Orders

## 2020-08-12 DIAGNOSIS — Z23 Encounter for immunization: Secondary | ICD-10-CM | POA: Diagnosis not present

## 2020-08-22 ENCOUNTER — Ambulatory Visit (HOSPITAL_COMMUNITY)
Admission: RE | Admit: 2020-08-22 | Discharge: 2020-08-22 | Disposition: A | Discharge status: Home / Self Care | Payer: Medicare Other | Source: Ambulatory Visit | Attending: Gastroenterology | Admitting: Gastroenterology

## 2020-08-22 ENCOUNTER — Other Ambulatory Visit: Payer: Self-pay

## 2020-08-22 DIAGNOSIS — N3289 Other specified disorders of bladder: Secondary | ICD-10-CM | POA: Diagnosis not present

## 2020-08-22 DIAGNOSIS — R198 Other specified symptoms and signs involving the digestive system and abdomen: Secondary | ICD-10-CM | POA: Diagnosis not present

## 2020-08-22 DIAGNOSIS — N400 Enlarged prostate without lower urinary tract symptoms: Secondary | ICD-10-CM | POA: Diagnosis not present

## 2020-08-22 DIAGNOSIS — N281 Cyst of kidney, acquired: Secondary | ICD-10-CM | POA: Diagnosis not present

## 2020-08-22 DIAGNOSIS — I700 Atherosclerosis of aorta: Secondary | ICD-10-CM | POA: Diagnosis not present

## 2020-08-22 LAB — POCT I-STAT CREATININE: Creatinine, Ser: 1.1 mg/dL (ref 0.61–1.24)

## 2020-08-22 MED ORDER — IOHEXOL 300 MG/ML  SOLN
100.0000 mL | Freq: Once | INTRAMUSCULAR | Status: AC | PRN
  Administered 2020-08-22: 100 mL via INTRAVENOUS

## 2020-08-25 ENCOUNTER — Other Ambulatory Visit: Payer: Self-pay | Admitting: *Deleted

## 2020-08-25 DIAGNOSIS — N289 Disorder of kidney and ureter, unspecified: Secondary | ICD-10-CM

## 2020-08-25 DIAGNOSIS — R9389 Abnormal findings on diagnostic imaging of other specified body structures: Secondary | ICD-10-CM

## 2020-08-25 NOTE — Addendum Note (Signed)
Addended by: Cheron Every on: 08/25/2020 10:19 AM   Modules accepted: Orders

## 2020-08-26 ENCOUNTER — Other Ambulatory Visit (HOSPITAL_COMMUNITY): Payer: Medicare Other

## 2020-09-01 ENCOUNTER — Telehealth: Payer: Self-pay | Admitting: Internal Medicine

## 2020-09-01 DIAGNOSIS — N400 Enlarged prostate without lower urinary tract symptoms: Secondary | ICD-10-CM | POA: Diagnosis not present

## 2020-09-01 DIAGNOSIS — R3912 Poor urinary stream: Secondary | ICD-10-CM | POA: Diagnosis not present

## 2020-09-01 DIAGNOSIS — R972 Elevated prostate specific antigen [PSA]: Secondary | ICD-10-CM | POA: Diagnosis not present

## 2020-09-01 DIAGNOSIS — N289 Disorder of kidney and ureter, unspecified: Secondary | ICD-10-CM | POA: Diagnosis not present

## 2020-09-01 NOTE — Telephone Encounter (Signed)
Please call patient about his ct scan, also has questions about going ahead with his colonoscopy

## 2020-09-01 NOTE — Telephone Encounter (Signed)
I would go ahead and complete colonoscopy. Glad to hear he is getting the concerning renal lesion looked at further.

## 2020-09-01 NOTE — Telephone Encounter (Signed)
Called pt back.  He informed me that he went to his Urologist and they are setting up MRI for him to have done in [place].  He said that he has a follow-up with them in 4 weeks.  He wanted to let us know and also would like to know if you want him to proceed with TCS on 09/11/2020 or hold off?

## 2020-09-01 NOTE — Telephone Encounter (Signed)
Lmom for pt to call back. 

## 2020-09-01 NOTE — Telephone Encounter (Signed)
Pt called back.  Informed him that Neil Crouch, PA recommends he go ahead and complete colonoscopy.  He was also informed that she was glad to hear that he is getting the renal lesion look at further.  Pt voiced understanding.

## 2020-09-03 ENCOUNTER — Ambulatory Visit (HOSPITAL_COMMUNITY): Payer: Medicare Other

## 2020-09-09 ENCOUNTER — Other Ambulatory Visit: Payer: Self-pay

## 2020-09-09 ENCOUNTER — Other Ambulatory Visit (HOSPITAL_COMMUNITY)
Admission: RE | Admit: 2020-09-09 | Discharge: 2020-09-09 | Disposition: A | Discharge status: Home / Self Care | Payer: Medicare Other | Source: Ambulatory Visit | Attending: Internal Medicine | Admitting: Internal Medicine

## 2020-09-09 ENCOUNTER — Other Ambulatory Visit (HOSPITAL_COMMUNITY): Payer: Medicare Other

## 2020-09-09 DIAGNOSIS — Z20822 Contact with and (suspected) exposure to covid-19: Secondary | ICD-10-CM | POA: Diagnosis not present

## 2020-09-09 DIAGNOSIS — Z01812 Encounter for preprocedural laboratory examination: Secondary | ICD-10-CM | POA: Insufficient documentation

## 2020-09-10 LAB — SARS CORONAVIRUS 2 (TAT 6-24 HRS): SARS Coronavirus 2: NEGATIVE

## 2020-09-11 ENCOUNTER — Ambulatory Visit (HOSPITAL_COMMUNITY): Payer: Medicare Other | Admitting: Anesthesiology

## 2020-09-11 ENCOUNTER — Encounter (HOSPITAL_COMMUNITY)
Admission: RE | Disposition: A | Discharge status: Home / Self Care | Payer: Self-pay | Source: Home / Self Care | Attending: Internal Medicine

## 2020-09-11 ENCOUNTER — Ambulatory Visit (HOSPITAL_COMMUNITY)
Admission: RE | Admit: 2020-09-11 | Discharge: 2020-09-11 | Disposition: A | Discharge status: Home / Self Care | Payer: Medicare Other | Attending: Internal Medicine | Admitting: Internal Medicine

## 2020-09-11 ENCOUNTER — Other Ambulatory Visit: Payer: Self-pay

## 2020-09-11 ENCOUNTER — Encounter (HOSPITAL_COMMUNITY): Payer: Self-pay | Admitting: Internal Medicine

## 2020-09-11 DIAGNOSIS — K635 Polyp of colon: Secondary | ICD-10-CM | POA: Diagnosis not present

## 2020-09-11 DIAGNOSIS — D122 Benign neoplasm of ascending colon: Secondary | ICD-10-CM | POA: Diagnosis not present

## 2020-09-11 DIAGNOSIS — Z1211 Encounter for screening for malignant neoplasm of colon: Secondary | ICD-10-CM | POA: Diagnosis not present

## 2020-09-11 HISTORY — PX: POLYPECTOMY: SHX5525

## 2020-09-11 HISTORY — PX: COLONOSCOPY WITH PROPOFOL: SHX5780

## 2020-09-11 SURGERY — COLONOSCOPY WITH PROPOFOL
Anesthesia: General

## 2020-09-11 MED ORDER — PROPOFOL 10 MG/ML IV BOLUS
INTRAVENOUS | Status: DC | PRN
  Administered 2020-09-11: 80 mg via INTRAVENOUS
  Administered 2020-09-11: 20 mg via INTRAVENOUS

## 2020-09-11 MED ORDER — LACTATED RINGERS IV SOLN: INTRAVENOUS | Status: DC

## 2020-09-11 MED ORDER — CHLORHEXIDINE GLUCONATE CLOTH 2 % EX PADS: 6.0000 | MEDICATED_PAD | Freq: Once | CUTANEOUS | Status: DC

## 2020-09-11 MED ORDER — PROPOFOL 10 MG/ML IV BOLUS
INTRAVENOUS | Status: AC
  Filled 2020-09-11: qty 200

## 2020-09-11 MED ORDER — LIDOCAINE HCL (CARDIAC) PF 100 MG/5ML IV SOSY
PREFILLED_SYRINGE | INTRAVENOUS | Status: DC | PRN
  Administered 2020-09-11: 50 mg via INTRAVENOUS

## 2020-09-11 MED ORDER — PROPOFOL 500 MG/50ML IV EMUL
INTRAVENOUS | Status: DC | PRN
  Administered 2020-09-11: 125 ug/kg/min via INTRAVENOUS

## 2020-09-11 NOTE — Anesthesia Postprocedure Evaluation (Signed)
Anesthesia Post Note  Patient: Devin Gould  Procedure(s) Performed: COLONOSCOPY WITH PROPOFOL (N/A ) POLYPECTOMY  Patient location during evaluation: Endoscopy Anesthesia Type: General Level of consciousness: awake and alert Pain management: pain level controlled Vital Signs Assessment: post-procedure vital signs reviewed and stable Respiratory status: spontaneous breathing Cardiovascular status: stable Postop Assessment: no apparent nausea or vomiting Anesthetic complications: no   No complications documented.   Last Vitals:  Vitals:   09/11/20 0646 09/11/20 0805  BP: (!) 156/91   Pulse: 61 68  Resp: 17 10  Temp: 36.5 C 36.5 C  SpO2: 100% 99%    Last Pain:  Vitals:   09/11/20 0805  TempSrc: Oral  PainSc: 0-No pain                 Everette Rank

## 2020-09-11 NOTE — Transfer of Care (Signed)
Immediate Anesthesia Transfer of Care Note  Patient: Devin Gould  Procedure(s) Performed: COLONOSCOPY WITH PROPOFOL (N/A ) POLYPECTOMY  Patient Location: Endoscopy Unit  Anesthesia Type:MAC and General  Level of Consciousness: awake, alert , oriented and patient cooperative  Airway & Oxygen Therapy: Patient Spontanous Breathing  Post-op Assessment: Report given to RN and Post -op Vital signs reviewed and stable  Post vital signs: Reviewed and stable  Last Vitals:  Vitals Value Taken Time  BP    Temp    Pulse    Resp    SpO2      Last Pain:  Vitals:   09/11/20 0730  TempSrc:   PainSc: 0-No pain      Patients Stated Pain Goal: 5 (47/09/29 5747)  Complications: No complications documented.

## 2020-09-11 NOTE — Anesthesia Preprocedure Evaluation (Signed)
Anesthesia Evaluation  Patient identified by MRN, date of birth, ID band Patient awake    Reviewed: Allergy & Precautions, H&P , NPO status , Patient's Chart, lab work & pertinent test results, reviewed documented beta blocker date and time   Airway Mallampati: II  TM Distance: >3 FB Neck ROM: full    Dental no notable dental hx.    Pulmonary neg pulmonary ROS,    Pulmonary exam normal breath sounds clear to auscultation       Cardiovascular Exercise Tolerance: Good negative cardio ROS   Rhythm:regular Rate:Normal     Neuro/Psych negative neurological ROS  negative psych ROS   GI/Hepatic negative GI ROS, Neg liver ROS,   Endo/Other  negative endocrine ROS  Renal/GU negative Renal ROS  negative genitourinary   Musculoskeletal   Abdominal   Peds  Hematology negative hematology ROS (+)   Anesthesia Other Findings   Reproductive/Obstetrics negative OB ROS                             Anesthesia Physical Anesthesia Plan  ASA: II  Anesthesia Plan: General   Post-op Pain Management:    Induction:   PONV Risk Score and Plan: Propofol infusion  Airway Management Planned:   Additional Equipment:   Intra-op Plan:   Post-operative Plan:   Informed Consent: I have reviewed the patients History and Physical, chart, labs and discussed the procedure including the risks, benefits and alternatives for the proposed anesthesia with the patient or authorized representative who has indicated his/her understanding and acceptance.     Dental Advisory Given  Plan Discussed with: CRNA  Anesthesia Plan Comments:         Anesthesia Quick Evaluation  

## 2020-09-11 NOTE — Op Note (Signed)
[facility] Patient Name: Devin Gould Procedure Date: 09/11/2020 7:01 AM MRN: [id number] Date of Birth: 1946/08/13 Attending MD: Norvel Richards , MD CSN: [id number] Age: 74 Admit Type: Outpatient Procedure:                Colonoscopy Indications:              Screening for colorectal malignant neoplasm Providers:                Norvel Richards, MD, Jeanann Lewandowsky. Sharon Seller, RN,                            Aram Candela Referring MD:              Medicines:                Propofol per Anesthesia Complications:            No immediate complications. Estimated Blood Loss:     Estimated blood loss was minimal. Procedure:                Pre-Anesthesia Assessment:                           - Prior to the procedure, a History and Physical                            was performed, and patient medications and                            allergies were reviewed. The patient's tolerance of                            previous anesthesia was also reviewed. The risks                            and benefits of the procedure and the sedation                            options and risks were discussed with the patient.                            All questions were answered, and informed consent                            was obtained. Prior Anticoagulants: The patient has                            taken no previous anticoagulant or antiplatelet                            agents. ASA Grade Assessment: II - A patient with                            mild systemic disease. After reviewing the risks  and benefits, the patient was deemed in                            satisfactory condition to undergo the procedure.                           After obtaining informed consent, the colonoscope                            was passed under direct vision. Throughout the                            procedure, the patient's blood pressure, pulse, and                             oxygen saturations were monitored continuously. The                            CF-[id number] ([id number]) scope was introduced through                            the anus and advanced to the the cecum, identified                            by appendiceal orifice and ileocecal valve. The                            colonoscopy was performed without difficulty. The                            patient tolerated the procedure well. The quality                            of the bowel preparation was adequate. Scope In: 7:37:34 AM Scope Out: 7:58:45 AM Scope Withdrawal Time: 0 hours 13 minutes 46 seconds  Total Procedure Duration: 0 hours 21 minutes 11 seconds  Findings:      The perianal and digital rectal examinations were normal.      A 6 mm polyp was found in the ascending colon. The polyp was sessile.       The polyp was removed with a cold snare. Resection and retrieval were       complete. Estimated blood loss was minimal.      The exam was otherwise without abnormality on direct and retroflexion       views. Impression:               - One 6 mm polyp in the ascending colon, removed                            with a cold snare. Resected and retrieved.                           - The examination was otherwise normal on direct  and retroflexion views. Moderate Sedation:      Moderate (conscious) sedation was personally administered by an       anesthesia professional. The following parameters were monitored: oxygen       saturation, heart rate, blood pressure, respiratory rate, EKG, adequacy       of pulmonary ventilation, and response to care. Recommendation:           - Patient has a contact number available for                            emergencies. The signs and symptoms of potential                            delayed complications were discussed with the                            patient. Return to normal activities tomorrow.                            Written  discharge instructions were provided to the                            patient.                           - Resume previous diet.                           - Continue present medications.                           - Repeat colonoscopy date to be determined after                            pending pathology results are reviewed for                            surveillance based on pathology results.                           - Return to GI office (date not yet determined). Procedure Code(s):        --- Professional ---                           [phone], Colonoscopy, flexible; with removal of                            tumor(s), polyp(s), or other lesion(s) by snare                            technique Diagnosis Code(s):        --- Professional ---                           Z12.11, Encounter for screening for malignant  neoplasm of colon                           K63.5, Polyp of colon CPT copyright 2019 American Medical Association. All rights reserved. The codes documented in this report are preliminary and upon coder review may  be revised to meet current compliance requirements. Cristopher Estimable. Kendyll Huettner, MD Norvel Richards, MD 09/11/2020 8:09:48 AM This report has been signed electronically. Number of Addenda: 0

## 2020-09-11 NOTE — H&P (Signed)
@LOGO @   Primary Care Physician:  Josem Kaufmann, MD Primary Gastroenterologist:  Dr. Gala Romney  Pre-Procedure History & Physical: HPI:  Devin Gould is a 74 y.o. male is here for a screening colonoscopy.  Negative colonoscopy Dr. Docia Furl 11 years ago.  No bowel symptoms.  No family history of colon cancer.  Past Medical History:  Diagnosis Date  . BPH (benign prostatic hyperplasia)     Past Surgical History:  Procedure Laterality Date  . BACK SURGERY    . EXCISIONAL HEMORRHOIDECTOMY    . KNEE ARTHROSCOPY Left   . Right shoulder surgery  12/2019  . TONSILLECTOMY      Prior to Admission medications   Medication Sig Start Date End Date Taking? Authorizing Provider  Ascorbic Acid (VITA-C PO) Take 500 mg by mouth every other day. Cranberry   Yes [provider]  Coenzyme Q10 (COQ-10 PO) Take 2 tablets by mouth once a week.    Yes [provider]  Cyanocobalamin (B-12 PO) Take 1 tablet by mouth daily.    Yes [provider]  Turmeric (QC TUMERIC COMPLEX PO) Take by mouth daily.   Yes [provider]    Allergies as of 08/04/2020  . (No Known Allergies)    Family History  Problem Relation Age of Onset  . Colon cancer Neg Hx     Social History   Socioeconomic History  . Marital status: Divorced    Spouse name: Not on file  . Number of children: Not on file  . Years of education: Not on file  . Highest education level: Not on file  Occupational History  . Not on file  Tobacco Use  . Smoking status: Never Smoker  . Smokeless tobacco: Never Used  Vaping Use  . Vaping Use: Never used  Substance and Sexual Activity  . Alcohol use: Yes    Comment: 0-4 beers per day  . Drug use: Never  . Sexual activity: Not on file  Other Topics Concern  . Not on file  Social History Narrative  . Not on file   Social Determinants of Health   Financial Resource Strain:   . Difficulty of Paying Living Expenses: Not on file  Food Insecurity:    . Worried About Charity fundraiser in the Last Year: Not on file  . Ran Out of Food in the Last Year: Not on file  Transportation Needs:   . Lack of Transportation (Medical): Not on file  . Lack of Transportation (Non-Medical): Not on file  Physical Activity:   . Days of Exercise per Week: Not on file  . Minutes of Exercise per Session: Not on file  Stress:   . Feeling of Stress : Not on file  Social Connections:   . Frequency of Communication with Friends and Family: Not on file  . Frequency of Social Gatherings with Friends and Family: Not on file  . Attends Religious Services: Not on file  . Active Member of Clubs or Organizations: Not on file  . Attends Archivist Meetings: Not on file  . Marital Status: Not on file  Intimate Partner Violence:   . Fear of Current or Ex-Partner: Not on file  . Emotionally Abused: Not on file  . Physically Abused: Not on file  . Sexually Abused: Not on file    Review of Systems: See HPI, otherwise negative ROS  Physical Exam: BP (!) 156/91   Pulse 61   Temp 97.7 F (36.5 C) (Oral)  Resp 17   Ht 6' (1.829 m)   Wt 99.8 kg   SpO2 100%   BMI 29.84 kg/m  General:   Alert,  Well-developed, well-nourished, pleasant and cooperative in NAD Lungs:  Clear throughout to auscultation.   No wheezes, crackles, or rhonchi. No acute distress. Heart:  Regular rate and rhythm; no murmurs, clicks, rubs,  or gallops. Abdomen:  Soft, nontender and nondistended. No masses, hepatosplenomegaly or hernias noted. Normal bowel sounds, without guarding, and without rebound.   Impression/Plan: Devin Gould is now here to undergo a screening colonoscopy.  Average rescreening examination.  Risks, benefits, limitations, imponderables and alternatives regarding colonoscopy have been reviewed with the patient. Questions have been answered. All parties agreeable.     Notice:  This dictation was prepared with Dragon dictation along with smaller phrase  technology. Any transcriptional errors that result from this process are unintentional and may not be corrected upon review.

## 2020-09-11 NOTE — Discharge Instructions (Signed)
Colonoscopy Discharge Instructions  Read the instructions outlined below and refer to this sheet in the next few weeks. These discharge instructions provide you with general information on caring for yourself after you leave the hospital. Your doctor may also give you specific instructions. While your treatment has been planned according to the most current medical practices available, unavoidable complications occasionally occur. If you have any problems or questions after discharge, call Dr. Gala Romney at [phone]. ACTIVITY  You may resume your regular activity, but move at a slower pace for the next 24 hours.   Take frequent rest periods for the next 24 hours.   Walking will help get rid of the air and reduce the bloated feeling in your belly (abdomen).   No driving for 24 hours (because of the medicine (anesthesia) used during the test).    Do not sign any important legal documents or operate any machinery for 24 hours (because of the anesthesia used during the test).  NUTRITION  Drink plenty of fluids.   You may resume your normal diet as instructed by your doctor.   Begin with a light meal and progress to your normal diet. Heavy or fried foods are harder to digest and may make you feel sick to your stomach (nauseated).   Avoid alcoholic beverages for 24 hours or as instructed.  MEDICATIONS  You may resume your normal medications unless your doctor tells you otherwise.  WHAT YOU CAN EXPECT TODAY  Some feelings of bloating in the abdomen.   Passage of more gas than usual.   Spotting of blood in your stool or on the toilet paper.  IF YOU HAD POLYPS REMOVED DURING THE COLONOSCOPY:  No aspirin products for 7 days or as instructed.   No alcohol for 7 days or as instructed.   Eat a soft diet for the next 24 hours.  FINDING OUT THE RESULTS OF YOUR TEST Not all test results are available during your visit. If your test results are not back during the visit, make an appointment  with your caregiver to find out the results. Do not assume everything is normal if you have not heard from your caregiver or the medical facility. It is important for you to follow up on all of your test results.  SEEK IMMEDIATE MEDICAL ATTENTION IF:  You have more than a spotting of blood in your stool.   Your belly is swollen (abdominal distention).   You are nauseated or vomiting.   You have a temperature over 101.   You have abdominal pain or discomfort that is severe or gets worse throughout the day.    1 polyp removed from your colon today  Further recommendations to follow pending review of pathology report  At patient request, I called Rutha Bouchard at [phone] -left message on answering machine with the results.    Monitored Anesthesia Care Anesthesia is a term that refers to techniques, procedures, and medicines that help a person stay safe and comfortable during a medical procedure. Monitored anesthesia care, or sedation, is one type of anesthesia. Your anesthesia specialist may recommend sedation if you will be having a procedure that does not require you to be unconscious, such as:  Cataract surgery.  A dental procedure.  A biopsy.  A colonoscopy. During the procedure, you may receive a medicine to help you relax (sedative). There are three levels of sedation:  Mild sedation. At this level, you may feel awake and relaxed. You will be able to follow directions.  Moderate  sedation. At this level, you will be sleepy. You may not remember the procedure.  Deep sedation. At this level, you will be asleep. You will not remember the procedure. The more medicine you are given, the deeper your level of sedation will be. Depending on how you respond to the procedure, the anesthesia specialist may change your level of sedation or the type of anesthesia to fit your needs. An anesthesia specialist will monitor you closely during the procedure. Let your health care provider  know about:  Any allergies you have.  All medicines you are taking, including vitamins, herbs, eye drops, creams, and over-the-counter medicines.  Any use of steroids (by mouth or as a cream).  Any problems you or family members have had with sedatives and anesthetic medicines.  Any blood disorders you have.  Any surgeries you have had.  Any medical conditions you have, such as sleep apnea.  Whether you are pregnant or may be pregnant.  Any use of cigarettes, alcohol, or street drugs. What are the risks? Generally, this is a safe procedure. However, problems may occur, including:  Getting too much medicine (oversedation).  Nausea.  Allergic reaction to medicines.  Trouble breathing. If this happens, a breathing tube may be used to help with breathing. It will be removed when you are awake and breathing on your own.  Heart trouble.  Lung trouble. Before the procedure Staying hydrated Follow instructions from your health care provider about hydration, which may include:  Up to 2 hours before the procedure - you may continue to drink clear liquids, such as water, clear fruit juice, black coffee, and plain tea. Eating and drinking restrictions Follow instructions from your health care provider about eating and drinking, which may include:  8 hours before the procedure - stop eating heavy meals or foods such as meat, fried foods, or fatty foods.  6 hours before the procedure - stop eating light meals or foods, such as toast or cereal.  6 hours before the procedure - stop drinking milk or drinks that contain milk.  2 hours before the procedure - stop drinking clear liquids. Medicines Ask your health care provider about:  Changing or stopping your regular medicines. This is especially important if you are taking diabetes medicines or blood thinners.  Taking medicines such as aspirin and ibuprofen. These medicines can thin your blood. Do not take these medicines before  your procedure if your health care provider instructs you not to. Tests and exams  You will have a physical exam.  You may have blood tests done to show: ? How well your kidneys and liver are working. ? How well your blood can clot. General instructions  Plan to have someone take you home from the hospital or clinic.  If you will be going home right after the procedure, plan to have someone with you for 24 hours.  What happens during the procedure?  Your blood pressure, heart rate, breathing, level of pain and overall condition will be monitored.  An IV tube will be inserted into one of your veins.  Your anesthesia specialist will give you medicines as needed to keep you comfortable during the procedure. This may mean changing the level of sedation.  The procedure will be performed. After the procedure  Your blood pressure, heart rate, breathing rate, and blood oxygen level will be monitored until the medicines you were given have worn off.  Do not drive for 24 hours if you received a sedative.  You may: ?  Feel sleepy, clumsy, or nauseous. ? Feel forgetful about what happened after the procedure. ? Have a sore throat if you had a breathing tube during the procedure. ? Vomit. This information is not intended to replace advice given to you by your health care provider. Make sure you discuss any questions you have with your health care provider. Document Revised: 10/21/2017 Document Reviewed: 02/29/2016 Elsevier Patient Education  [place].    Colon Polyps  Polyps are tissue growths inside the body. Polyps can grow in many places, including the large intestine (colon). A polyp may be a round bump or a mushroom-shaped growth. You could have one polyp or several. Most colon polyps are noncancerous (benign). However, some colon polyps can become cancerous over time. Finding and removing the polyps early can help prevent this. What are the causes? The exact cause of  colon polyps is not known. What increases the risk? You are more likely to develop this condition if you:  Have a family history of colon cancer or colon polyps.  Are older than 26 or older than 45 if you are African American.  Have inflammatory bowel disease, such as ulcerative colitis or Crohn's disease.  Have certain hereditary conditions, such as: ? Familial adenomatous polyposis. ? Lynch syndrome. ? Turcot syndrome. ? Peutz-Jeghers syndrome.  Are overweight.  Smoke cigarettes.  Do not get enough exercise.  Drink too much alcohol.  Eat a diet that is high in fat and red meat and low in fiber.  Had childhood cancer that was treated with abdominal radiation. What are the signs or symptoms? Most polyps do not cause symptoms. If you have symptoms, they may include:  Blood coming from your rectum when having a bowel movement.  Blood in your stool. The stool may look dark red or black.  Abdominal pain.  A change in bowel habits, such as constipation or diarrhea. How is this diagnosed? This condition is diagnosed with a colonoscopy. This is a procedure in which a lighted, flexible scope is inserted into the anus and then passed into the colon to examine the area. Polyps are sometimes found when a colonoscopy is done as part of routine cancer screening tests. How is this treated? Treatment for this condition involves removing any polyps that are found. Most polyps can be removed during a colonoscopy. Those polyps will then be tested for cancer. Additional treatment may be needed depending on the results of testing. Follow these instructions at home: Lifestyle  Maintain a healthy weight, or lose weight if recommended by your health care provider.  Exercise every day or as told by your health care provider.  Do not use any products that contain nicotine or tobacco, such as cigarettes and e-cigarettes. If you need help quitting, ask your health care provider.  If you drink  alcohol, limit how much you have: ? 0-1 drink a day for women. ? 0-2 drinks a day for men.  Be aware of how much alcohol is in your drink. In the [place] one drink equals one 12 oz bottle of beer (355 mL), one 5 oz glass of wine (148 mL), or one 1 oz shot of hard liquor (44 mL). Eating and drinking   Eat foods that are high in fiber, such as fruits, vegetables, and whole grains.  Eat foods that are high in calcium and vitamin D, such as milk, cheese, yogurt, eggs, liver, fish, and broccoli.  Limit foods that are high in fat, such as fried foods and desserts.  Limit the amount of red meat and processed meat you eat, such as hot dogs, sausage, bacon, and lunch meats. General instructions  Keep all follow-up visits as told by your health care provider. This is important. ? This includes having regularly scheduled colonoscopies. ? Talk to your health care provider about when you need a colonoscopy. Contact a health care provider if:  You have new or worsening bleeding during a bowel movement.  You have new or increased blood in your stool.  You have a change in bowel habits.  You lose weight for no known reason. Summary  Polyps are tissue growths inside the body. Polyps can grow in many places, including the colon.  Most colon polyps are noncancerous (benign), but some can become cancerous over time.  This condition is diagnosed with a colonoscopy.  Treatment for this condition involves removing any polyps that are found. Most polyps can be removed during a colonoscopy. This information is not intended to replace advice given to you by your health care provider. Make sure you discuss any questions you have with your health care provider. Document Revised: 02/23/2018 Document Reviewed: 02/23/2018 Elsevier Patient Education  [place].

## 2020-09-12 ENCOUNTER — Encounter: Payer: Self-pay | Admitting: Internal Medicine

## 2020-09-12 LAB — SURGICAL PATHOLOGY

## 2020-09-15 DIAGNOSIS — N289 Disorder of kidney and ureter, unspecified: Secondary | ICD-10-CM | POA: Diagnosis not present

## 2020-09-16 ENCOUNTER — Encounter (HOSPITAL_COMMUNITY): Payer: Self-pay | Admitting: Internal Medicine

## 2020-09-22 ENCOUNTER — Encounter: Payer: Self-pay | Admitting: Gastroenterology

## 2020-09-29 DIAGNOSIS — N281 Cyst of kidney, acquired: Secondary | ICD-10-CM | POA: Diagnosis not present

## 2020-09-29 DIAGNOSIS — R972 Elevated prostate specific antigen [PSA]: Secondary | ICD-10-CM | POA: Diagnosis not present

## 2020-09-29 DIAGNOSIS — R351 Nocturia: Secondary | ICD-10-CM | POA: Diagnosis not present

## 2020-09-29 DIAGNOSIS — N400 Enlarged prostate without lower urinary tract symptoms: Secondary | ICD-10-CM | POA: Diagnosis not present

## 2020-11-08 DIAGNOSIS — Z23 Encounter for immunization: Secondary | ICD-10-CM | POA: Diagnosis not present

## 2021-02-13 DIAGNOSIS — H2513 Age-related nuclear cataract, bilateral: Secondary | ICD-10-CM | POA: Diagnosis not present

## 2021-02-13 DIAGNOSIS — H3550 Unspecified hereditary retinal dystrophy: Secondary | ICD-10-CM | POA: Diagnosis not present

## 2021-02-13 DIAGNOSIS — H524 Presbyopia: Secondary | ICD-10-CM | POA: Diagnosis not present

## 2021-02-13 DIAGNOSIS — H5203 Hypermetropia, bilateral: Secondary | ICD-10-CM | POA: Diagnosis not present

## 2021-02-13 DIAGNOSIS — H52223 Regular astigmatism, bilateral: Secondary | ICD-10-CM | POA: Diagnosis not present

## 2021-04-23 DIAGNOSIS — R070 Pain in throat: Secondary | ICD-10-CM | POA: Diagnosis not present

## 2021-04-23 DIAGNOSIS — J020 Streptococcal pharyngitis: Secondary | ICD-10-CM | POA: Diagnosis not present

## 2021-04-23 DIAGNOSIS — R0789 Other chest pain: Secondary | ICD-10-CM | POA: Diagnosis not present

## 2021-07-16 DIAGNOSIS — M654 Radial styloid tenosynovitis [de Quervain]: Secondary | ICD-10-CM | POA: Diagnosis not present

## 2021-07-23 IMAGING — CT CT ABD-PELV W/ CM
2 of 5 series · 16 of 46 positions shown, 18 images · IV contrast (Omnipaque or Isovue)
Comparison: None.

CLINICAL DATA: Lower abdominal/right lower quadrant fullness on
exam. BPH.

EXAM:
CT ABDOMEN AND PELVIS WITH CONTRAST
TECHNIQUE: Multidetector CT imaging of the abdomen and pelvis was performed
using the standard protocol following bolus administration of
intravenous contrast.
CONTRAST:  100mL OMNIPAQUE IOHEXOL 300 MG/ML  SOLN

[Series 2: axial st · axial · 0.83mm/px · z∈[+725,+1175]mm · 13 of 103 slices shown, 15 images]
[im 7/103  soft-tissue]
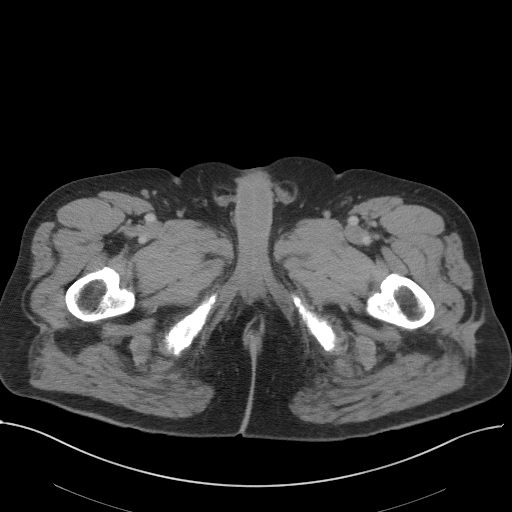
[im 7/103  bone]
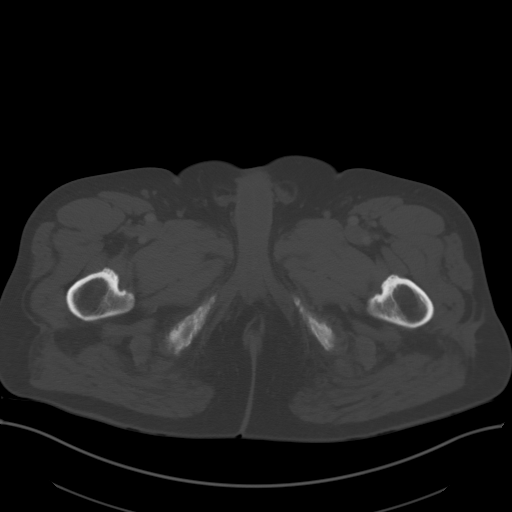
[im 13/103  soft-tissue]
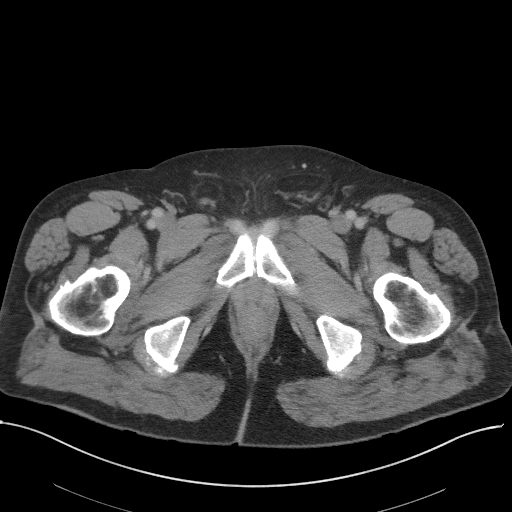
[im 25/103  soft-tissue]
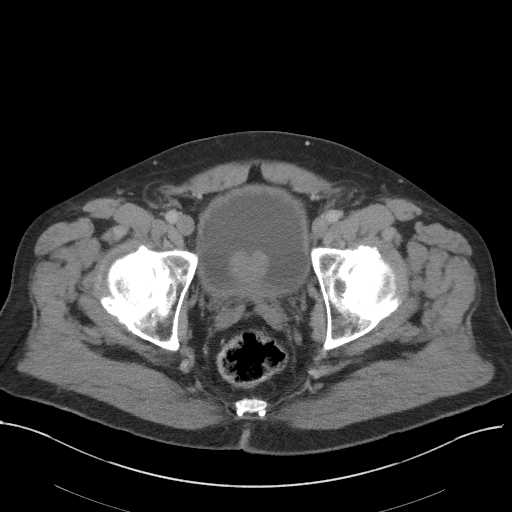
[im 31/103  soft-tissue]
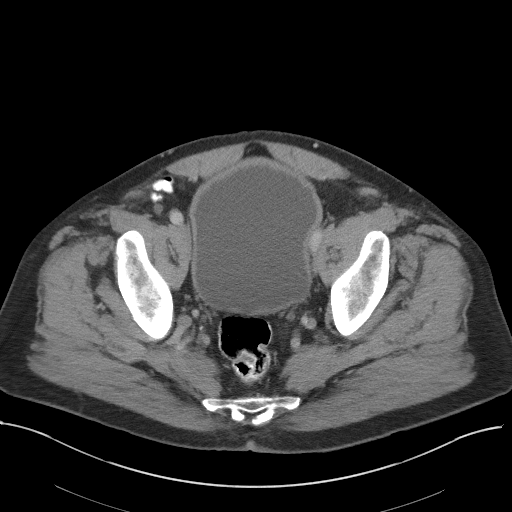
[im 37/103  soft-tissue]
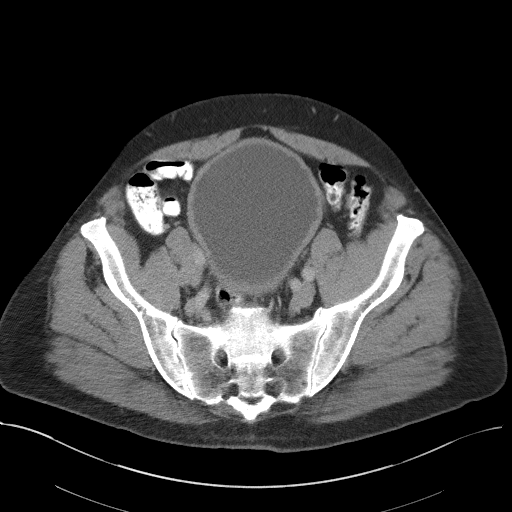
[im 43/103  soft-tissue]
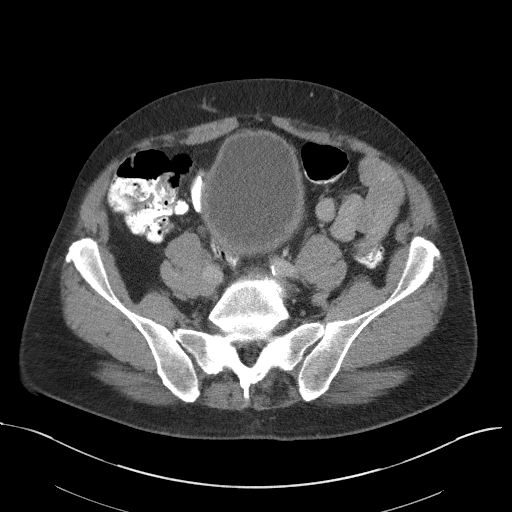
[im 55/103  soft-tissue]
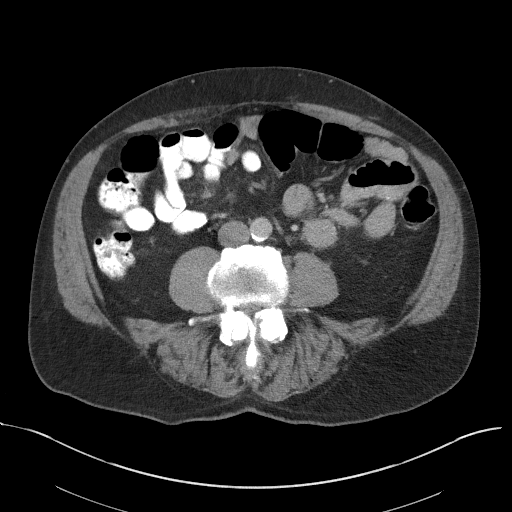
[im 61/103  soft-tissue]
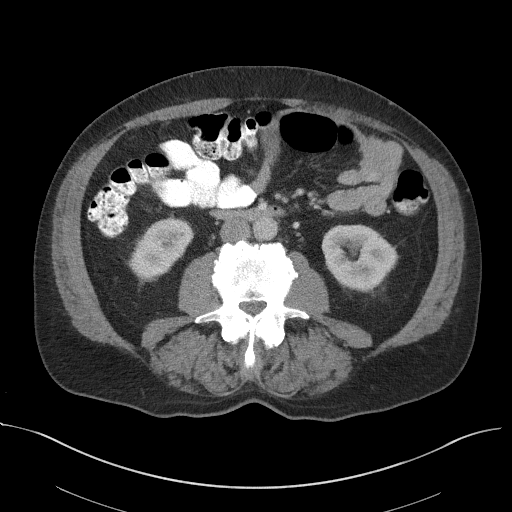
[im 67/103  soft-tissue]
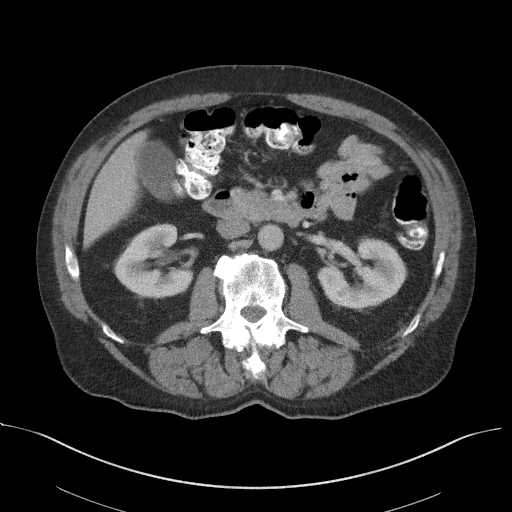
[im 67/103  bone]
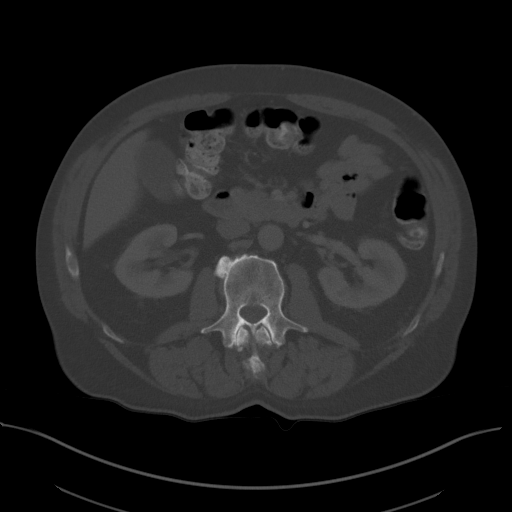
[im 73/103  soft-tissue]
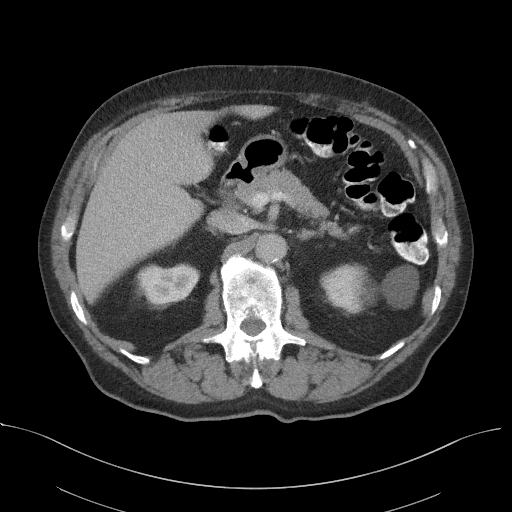
[im 79/103  soft-tissue]
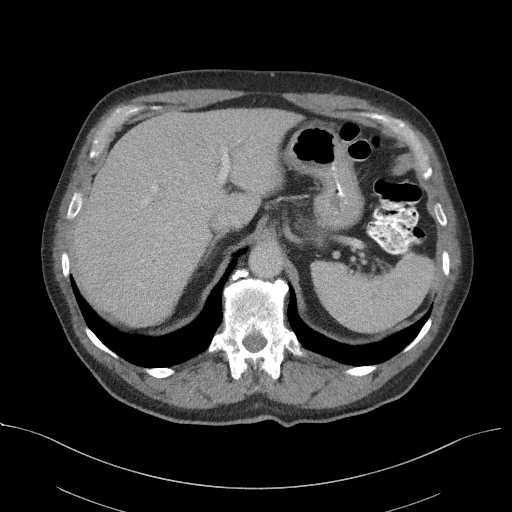
[im 91/103  soft-tissue]
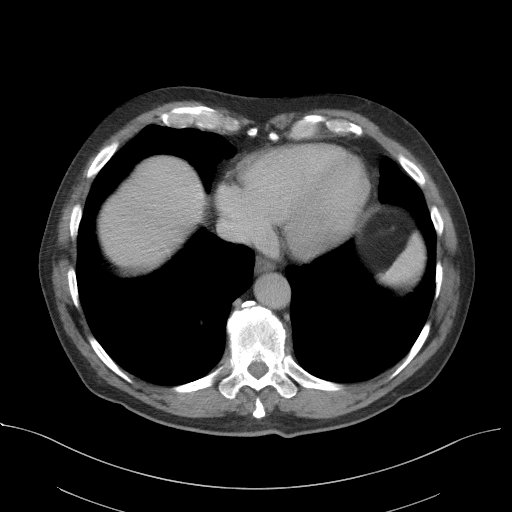
[im 97/103  soft-tissue]
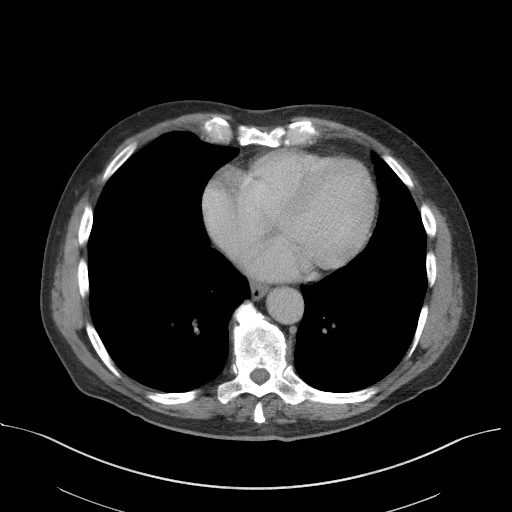

[Series 6: coronal st · coronal · 0.87mm/px · 3 of 117 slices shown]
[im 39/117  soft-tissue]
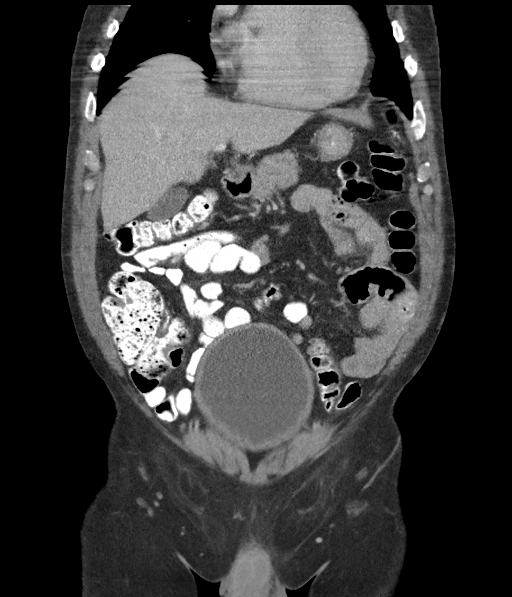
[im 52/117  soft-tissue]
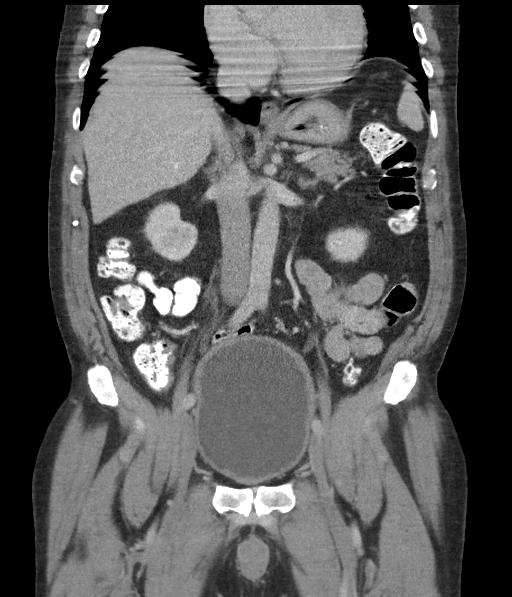
[im 65/117  soft-tissue]
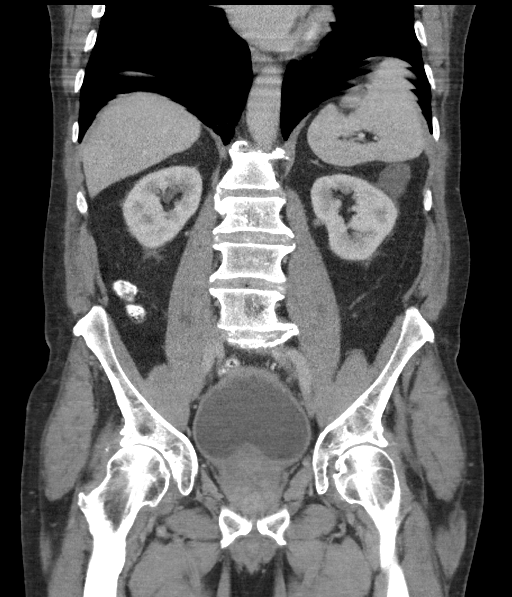

[16 of 46 positions shown; findings below may reference images not displayed]

FINDINGS: Lower chest: No significant pulmonary nodules or acute consolidative
airspace disease.

Hepatobiliary: Normal liver size. Subcentimeter hypodense inferior
right liver lesion is too small to characterize and requires no
follow-up unless the patient has risk factors for liver malignancy.
No additional liver lesions. Normal gallbladder with no radiopaque
cholelithiasis. No biliary ductal dilatation.

Pancreas: Normal, with no mass or duct dilation.

Spleen: Normal size. No mass.

Adrenals/Urinary Tract: Normal adrenals. Exophytic simple 3.5 cm
lateral upper left renal cyst. Hypodense exophytic 1.1 cm renal
cortical lesion in the medial interpolar left kidney (series 3/image
31). Additional scattered subcentimeter hypodense renal cortical
lesions in the left kidney, too small to characterize. Moderate
diffuse bladder wall thickening and trabeculation with moderate
bladder distention.

Stomach/Bowel: Normal non-distended stomach. Normal caliber small
bowel with no small bowel wall thickening. Normal appendix. Oral
contrast transits to rectum. Normal large bowel with no
diverticulosis, large bowel wall thickening or pericolonic fat
stranding.

Vascular/Lymphatic: Atherosclerotic nonaneurysmal abdominal aorta.
Patent portal, splenic, hepatic and renal veins. No pathologically
enlarged lymph nodes in the abdomen or pelvis.

Reproductive: Moderate prostatomegaly with mass-effect on the
bladder base by the enlarged nodular median lobe of the prostate.

Other: No pneumoperitoneum, ascites or focal fluid collection.

Musculoskeletal: No aggressive appearing focal osseous lesions.
Moderate thoracolumbar spondylosis.
IMPRESSION: 1. Moderate diffuse bladder wall thickening and trabeculation with
moderate bladder distention, suggesting chronic bladder outlet
obstruction by the enlarged prostate. No hydronephrosis.
2. Indeterminate exophytic 1.1 cm renal cortical lesion in the
medial interpolar left kidney, cannot exclude renal neoplasm.
Suggest further characterization with MRI abdomen without and with
IV contrast.
3. No evidence of bowel obstruction or acute bowel inflammation.
Normal appendix.
4. Aortic Atherosclerosis ([id number]).

## 2021-08-18 DIAGNOSIS — M654 Radial styloid tenosynovitis [de Quervain]: Secondary | ICD-10-CM | POA: Diagnosis not present

## 2021-09-01 DIAGNOSIS — M9902 Segmental and somatic dysfunction of thoracic region: Secondary | ICD-10-CM | POA: Diagnosis not present

## 2021-09-01 DIAGNOSIS — M9903 Segmental and somatic dysfunction of lumbar region: Secondary | ICD-10-CM | POA: Diagnosis not present

## 2021-09-01 DIAGNOSIS — S33141A Dislocation of L4/L5 lumbar vertebra, initial encounter: Secondary | ICD-10-CM | POA: Diagnosis not present

## 2021-10-09 DIAGNOSIS — N400 Enlarged prostate without lower urinary tract symptoms: Secondary | ICD-10-CM | POA: Diagnosis not present

## 2021-10-09 DIAGNOSIS — R972 Elevated prostate specific antigen [PSA]: Secondary | ICD-10-CM | POA: Diagnosis not present

## 2021-10-09 DIAGNOSIS — N289 Disorder of kidney and ureter, unspecified: Secondary | ICD-10-CM | POA: Diagnosis not present

## 2021-10-09 DIAGNOSIS — N401 Enlarged prostate with lower urinary tract symptoms: Secondary | ICD-10-CM | POA: Diagnosis not present

## 2021-12-10 DIAGNOSIS — R059 Cough, unspecified: Secondary | ICD-10-CM | POA: Diagnosis not present

## 2021-12-10 DIAGNOSIS — J329 Chronic sinusitis, unspecified: Secondary | ICD-10-CM | POA: Diagnosis not present

## 2021-12-14 DIAGNOSIS — M25539 Pain in unspecified wrist: Secondary | ICD-10-CM | POA: Diagnosis not present

## 2022-02-19 DIAGNOSIS — H2513 Age-related nuclear cataract, bilateral: Secondary | ICD-10-CM | POA: Diagnosis not present

## 2022-02-19 DIAGNOSIS — H5203 Hypermetropia, bilateral: Secondary | ICD-10-CM | POA: Diagnosis not present

## 2022-02-19 DIAGNOSIS — H3550 Unspecified hereditary retinal dystrophy: Secondary | ICD-10-CM | POA: Diagnosis not present

## 2022-02-19 DIAGNOSIS — H52223 Regular astigmatism, bilateral: Secondary | ICD-10-CM | POA: Diagnosis not present

## 2022-02-19 DIAGNOSIS — H524 Presbyopia: Secondary | ICD-10-CM | POA: Diagnosis not present

## 2022-04-09 DIAGNOSIS — N289 Disorder of kidney and ureter, unspecified: Secondary | ICD-10-CM | POA: Diagnosis not present

## 2022-04-09 DIAGNOSIS — N401 Enlarged prostate with lower urinary tract symptoms: Secondary | ICD-10-CM | POA: Diagnosis not present

## 2022-04-09 DIAGNOSIS — R972 Elevated prostate specific antigen [PSA]: Secondary | ICD-10-CM | POA: Diagnosis not present

## 2022-05-03 DIAGNOSIS — M9902 Segmental and somatic dysfunction of thoracic region: Secondary | ICD-10-CM | POA: Diagnosis not present

## 2022-05-03 DIAGNOSIS — S33141A Dislocation of L4/L5 lumbar vertebra, initial encounter: Secondary | ICD-10-CM | POA: Diagnosis not present

## 2022-05-03 DIAGNOSIS — M9903 Segmental and somatic dysfunction of lumbar region: Secondary | ICD-10-CM | POA: Diagnosis not present

## 2022-05-10 DIAGNOSIS — D2362 Other benign neoplasm of skin of left upper limb, including shoulder: Secondary | ICD-10-CM | POA: Diagnosis not present

## 2022-05-10 DIAGNOSIS — D2361 Other benign neoplasm of skin of right upper limb, including shoulder: Secondary | ICD-10-CM | POA: Diagnosis not present

## 2022-05-10 DIAGNOSIS — D234 Other benign neoplasm of skin of scalp and neck: Secondary | ICD-10-CM | POA: Diagnosis not present

## 2022-05-10 DIAGNOSIS — Z719 Counseling, unspecified: Secondary | ICD-10-CM | POA: Diagnosis not present

## 2022-05-10 DIAGNOSIS — D23121 Other benign neoplasm of skin of left upper eyelid, including canthus: Secondary | ICD-10-CM | POA: Diagnosis not present

## 2022-05-10 DIAGNOSIS — D230 Other benign neoplasm of skin of lip: Secondary | ICD-10-CM | POA: Diagnosis not present

## 2022-05-10 DIAGNOSIS — D235 Other benign neoplasm of skin of trunk: Secondary | ICD-10-CM | POA: Diagnosis not present

## 2022-05-10 DIAGNOSIS — D2339 Other benign neoplasm of skin of other parts of face: Secondary | ICD-10-CM | POA: Diagnosis not present

## 2022-05-10 DIAGNOSIS — D2322 Other benign neoplasm of skin of left ear and external auricular canal: Secondary | ICD-10-CM | POA: Diagnosis not present

## 2022-05-10 DIAGNOSIS — D2321 Other benign neoplasm of skin of right ear and external auricular canal: Secondary | ICD-10-CM | POA: Diagnosis not present

## 2022-05-10 DIAGNOSIS — Z1283 Encounter for screening for malignant neoplasm of skin: Secondary | ICD-10-CM | POA: Diagnosis not present

## 2022-05-10 DIAGNOSIS — D23111 Other benign neoplasm of skin of right upper eyelid, including canthus: Secondary | ICD-10-CM | POA: Diagnosis not present

## 2022-07-06 DIAGNOSIS — D23111 Other benign neoplasm of skin of right upper eyelid, including canthus: Secondary | ICD-10-CM | POA: Diagnosis not present

## 2022-07-06 DIAGNOSIS — D2362 Other benign neoplasm of skin of left upper limb, including shoulder: Secondary | ICD-10-CM | POA: Diagnosis not present

## 2022-07-06 DIAGNOSIS — Z1283 Encounter for screening for malignant neoplasm of skin: Secondary | ICD-10-CM | POA: Diagnosis not present

## 2022-07-06 DIAGNOSIS — D2361 Other benign neoplasm of skin of right upper limb, including shoulder: Secondary | ICD-10-CM | POA: Diagnosis not present

## 2022-07-06 DIAGNOSIS — D2321 Other benign neoplasm of skin of right ear and external auricular canal: Secondary | ICD-10-CM | POA: Diagnosis not present

## 2022-07-06 DIAGNOSIS — D2322 Other benign neoplasm of skin of left ear and external auricular canal: Secondary | ICD-10-CM | POA: Diagnosis not present

## 2022-07-06 DIAGNOSIS — D234 Other benign neoplasm of skin of scalp and neck: Secondary | ICD-10-CM | POA: Diagnosis not present

## 2022-07-06 DIAGNOSIS — D170 Benign lipomatous neoplasm of skin and subcutaneous tissue of head, face and neck: Secondary | ICD-10-CM | POA: Diagnosis not present

## 2022-07-06 DIAGNOSIS — D485 Neoplasm of uncertain behavior of skin: Secondary | ICD-10-CM | POA: Diagnosis not present

## 2022-07-06 DIAGNOSIS — Z719 Counseling, unspecified: Secondary | ICD-10-CM | POA: Diagnosis not present

## 2022-07-06 DIAGNOSIS — D2339 Other benign neoplasm of skin of other parts of face: Secondary | ICD-10-CM | POA: Diagnosis not present

## 2022-07-06 DIAGNOSIS — D230 Other benign neoplasm of skin of lip: Secondary | ICD-10-CM | POA: Diagnosis not present

## 2022-07-06 DIAGNOSIS — D23121 Other benign neoplasm of skin of left upper eyelid, including canthus: Secondary | ICD-10-CM | POA: Diagnosis not present

## 2022-07-13 DIAGNOSIS — D170 Benign lipomatous neoplasm of skin and subcutaneous tissue of head, face and neck: Secondary | ICD-10-CM | POA: Diagnosis not present

## 2022-07-13 DIAGNOSIS — Z4802 Encounter for removal of sutures: Secondary | ICD-10-CM | POA: Diagnosis not present

## 2022-09-07 DIAGNOSIS — M9903 Segmental and somatic dysfunction of lumbar region: Secondary | ICD-10-CM | POA: Diagnosis not present

## 2022-09-07 DIAGNOSIS — M9902 Segmental and somatic dysfunction of thoracic region: Secondary | ICD-10-CM | POA: Diagnosis not present

## 2022-09-07 DIAGNOSIS — S33141A Dislocation of L4/L5 lumbar vertebra, initial encounter: Secondary | ICD-10-CM | POA: Diagnosis not present

## 2022-10-08 DIAGNOSIS — N289 Disorder of kidney and ureter, unspecified: Secondary | ICD-10-CM | POA: Diagnosis not present

## 2022-10-08 DIAGNOSIS — R972 Elevated prostate specific antigen [PSA]: Secondary | ICD-10-CM | POA: Diagnosis not present

## 2022-10-08 DIAGNOSIS — N401 Enlarged prostate with lower urinary tract symptoms: Secondary | ICD-10-CM | POA: Diagnosis not present
